# Patient Record
Sex: Female | Born: 1990 | Race: White | Hispanic: No | Marital: Single
Health system: Southern US, Community
[De-identification: ages and names within clinical notes are randomized; demographics above are authoritative.]

---
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
## 2019-07-02 DIAGNOSIS — Z419 Encounter for procedure for purposes other than remedying health state, unspecified: Secondary | ICD-10-CM | POA: Diagnosis not present

## 2019-08-02 DIAGNOSIS — Z419 Encounter for procedure for purposes other than remedying health state, unspecified: Secondary | ICD-10-CM | POA: Diagnosis not present

## 2019-08-15 DIAGNOSIS — Z20822 Contact with and (suspected) exposure to covid-19: Secondary | ICD-10-CM | POA: Diagnosis not present

## 2019-09-02 DIAGNOSIS — Z419 Encounter for procedure for purposes other than remedying health state, unspecified: Secondary | ICD-10-CM | POA: Diagnosis not present

## 2019-10-02 DIAGNOSIS — Z419 Encounter for procedure for purposes other than remedying health state, unspecified: Secondary | ICD-10-CM | POA: Diagnosis not present

## 2019-11-02 DIAGNOSIS — Z419 Encounter for procedure for purposes other than remedying health state, unspecified: Secondary | ICD-10-CM | POA: Diagnosis not present

## 2019-12-02 DIAGNOSIS — Z419 Encounter for procedure for purposes other than remedying health state, unspecified: Secondary | ICD-10-CM | POA: Diagnosis not present

## 2020-01-02 DIAGNOSIS — Z419 Encounter for procedure for purposes other than remedying health state, unspecified: Secondary | ICD-10-CM | POA: Diagnosis not present

## 2020-02-02 DIAGNOSIS — Z419 Encounter for procedure for purposes other than remedying health state, unspecified: Secondary | ICD-10-CM | POA: Diagnosis not present

## 2020-03-01 DIAGNOSIS — Z419 Encounter for procedure for purposes other than remedying health state, unspecified: Secondary | ICD-10-CM | POA: Diagnosis not present

## 2020-04-01 DIAGNOSIS — Z419 Encounter for procedure for purposes other than remedying health state, unspecified: Secondary | ICD-10-CM | POA: Diagnosis not present

## 2020-05-01 DIAGNOSIS — Z419 Encounter for procedure for purposes other than remedying health state, unspecified: Secondary | ICD-10-CM | POA: Diagnosis not present

## 2020-05-06 DIAGNOSIS — H938X2 Other specified disorders of left ear: Secondary | ICD-10-CM | POA: Diagnosis not present

## 2020-05-06 DIAGNOSIS — Z021 Encounter for pre-employment examination: Secondary | ICD-10-CM | POA: Diagnosis not present

## 2020-05-06 DIAGNOSIS — Z6837 Body mass index (BMI) 37.0-37.9, adult: Secondary | ICD-10-CM | POA: Diagnosis not present

## 2020-06-01 DIAGNOSIS — Z419 Encounter for procedure for purposes other than remedying health state, unspecified: Secondary | ICD-10-CM | POA: Diagnosis not present

## 2020-07-01 DIAGNOSIS — Z419 Encounter for procedure for purposes other than remedying health state, unspecified: Secondary | ICD-10-CM | POA: Diagnosis not present

## 2020-08-01 DIAGNOSIS — Z419 Encounter for procedure for purposes other than remedying health state, unspecified: Secondary | ICD-10-CM | POA: Diagnosis not present

## 2020-08-19 DIAGNOSIS — H5213 Myopia, bilateral: Secondary | ICD-10-CM | POA: Diagnosis not present

## 2020-09-01 DIAGNOSIS — Z419 Encounter for procedure for purposes other than remedying health state, unspecified: Secondary | ICD-10-CM | POA: Diagnosis not present

## 2020-10-01 DIAGNOSIS — Z419 Encounter for procedure for purposes other than remedying health state, unspecified: Secondary | ICD-10-CM | POA: Diagnosis not present

## 2020-11-01 DIAGNOSIS — Z419 Encounter for procedure for purposes other than remedying health state, unspecified: Secondary | ICD-10-CM | POA: Diagnosis not present

## 2020-12-01 DIAGNOSIS — Z419 Encounter for procedure for purposes other than remedying health state, unspecified: Secondary | ICD-10-CM | POA: Diagnosis not present

## 2021-01-01 DIAGNOSIS — Z419 Encounter for procedure for purposes other than remedying health state, unspecified: Secondary | ICD-10-CM | POA: Diagnosis not present

## 2021-02-01 DIAGNOSIS — Z419 Encounter for procedure for purposes other than remedying health state, unspecified: Secondary | ICD-10-CM | POA: Diagnosis not present

## 2021-03-01 DIAGNOSIS — Z419 Encounter for procedure for purposes other than remedying health state, unspecified: Secondary | ICD-10-CM | POA: Diagnosis not present

## 2021-04-01 DIAGNOSIS — Z419 Encounter for procedure for purposes other than remedying health state, unspecified: Secondary | ICD-10-CM | POA: Diagnosis not present

## 2021-05-01 DIAGNOSIS — Z419 Encounter for procedure for purposes other than remedying health state, unspecified: Secondary | ICD-10-CM | POA: Diagnosis not present

## 2021-06-01 DIAGNOSIS — Z419 Encounter for procedure for purposes other than remedying health state, unspecified: Secondary | ICD-10-CM | POA: Diagnosis not present

## 2021-07-01 DIAGNOSIS — Z419 Encounter for procedure for purposes other than remedying health state, unspecified: Secondary | ICD-10-CM | POA: Diagnosis not present

## 2021-08-01 DIAGNOSIS — Z419 Encounter for procedure for purposes other than remedying health state, unspecified: Secondary | ICD-10-CM | POA: Diagnosis not present

## 2021-09-01 DIAGNOSIS — Z419 Encounter for procedure for purposes other than remedying health state, unspecified: Secondary | ICD-10-CM | POA: Diagnosis not present

## 2021-10-01 DIAGNOSIS — Z419 Encounter for procedure for purposes other than remedying health state, unspecified: Secondary | ICD-10-CM | POA: Diagnosis not present

## 2021-11-01 DIAGNOSIS — Z419 Encounter for procedure for purposes other than remedying health state, unspecified: Secondary | ICD-10-CM | POA: Diagnosis not present

## 2021-12-01 DIAGNOSIS — Z419 Encounter for procedure for purposes other than remedying health state, unspecified: Secondary | ICD-10-CM | POA: Diagnosis not present

## 2022-01-01 DIAGNOSIS — Z419 Encounter for procedure for purposes other than remedying health state, unspecified: Secondary | ICD-10-CM | POA: Diagnosis not present

## 2022-02-01 DIAGNOSIS — Z419 Encounter for procedure for purposes other than remedying health state, unspecified: Secondary | ICD-10-CM | POA: Diagnosis not present

## 2022-03-02 DIAGNOSIS — Z419 Encounter for procedure for purposes other than remedying health state, unspecified: Secondary | ICD-10-CM | POA: Diagnosis not present

## 2022-04-02 DIAGNOSIS — Z419 Encounter for procedure for purposes other than remedying health state, unspecified: Secondary | ICD-10-CM | POA: Diagnosis not present

## 2022-05-02 DIAGNOSIS — Z419 Encounter for procedure for purposes other than remedying health state, unspecified: Secondary | ICD-10-CM | POA: Diagnosis not present

## 2022-06-02 DIAGNOSIS — Z419 Encounter for procedure for purposes other than remedying health state, unspecified: Secondary | ICD-10-CM | POA: Diagnosis not present

## 2022-07-02 DIAGNOSIS — Z419 Encounter for procedure for purposes other than remedying health state, unspecified: Secondary | ICD-10-CM | POA: Diagnosis not present

## 2022-07-12 ENCOUNTER — Telehealth: Payer: Self-pay

## 2022-07-12 NOTE — Telephone Encounter (Signed)
Chart review completed for patient. Patient is due for cervical cancer screen. Attempted to reach patient, but no answer.  Elijio Miles, Population Health Specialist.

## 2022-09-24 ENCOUNTER — Ambulatory Visit: Payer: 59

## 2022-09-24 ENCOUNTER — Encounter: Payer: Self-pay | Admitting: Podiatry

## 2022-09-24 ENCOUNTER — Ambulatory Visit (INDEPENDENT_AMBULATORY_CARE_PROVIDER_SITE_OTHER): Payer: 59 | Admitting: Podiatry

## 2022-09-24 DIAGNOSIS — M778 Other enthesopathies, not elsewhere classified: Secondary | ICD-10-CM

## 2022-09-24 DIAGNOSIS — M67471 Ganglion, right ankle and foot: Secondary | ICD-10-CM | POA: Diagnosis not present

## 2022-09-24 MED ORDER — TRIAMCINOLONE ACETONIDE 40 MG/ML IJ SUSP
20.0000 mg | Freq: Once | INTRAMUSCULAR | Status: AC
  Administered 2022-09-24: 20 mg

## 2022-09-24 NOTE — Progress Notes (Signed)
Subjective:  Patient ID: Kathy Gomez, female    DOB: [birth year],  MRN: [id number] HPI Chief Complaint  Patient presents with   Foot Pain    Dorsal/lateral foot right - small, palpable knot x few months, has went away and came back, this time its tender   New Patient (Initial Visit)    32 y.o. female presents with the above complaint.   ROS: Denies fever chills nausea vomit muscle aches pains calf pain back pain chest pain shortness of breath.  No past medical history on file.  No current outpatient medications on file.  No Known Allergies Review of Systems Objective:  There were no vitals filed for this visit.  General: Well developed, nourished, in no acute distress, alert and oriented x3   Dermatological: Skin is warm, dry and supple bilateral. Nails x 10 are well maintained; remaining integument appears unremarkable at this time. There are no open sores, no preulcerative lesions, no rash or signs of infection present.  Small nonpalpable mass proximal interspace for tender on manipulation but readily movable.  Appears to be a ganglion cyst.  Vascular: Dorsalis Pedis artery and Posterior Tibial artery pedal pulses are 2/4 bilateral with immedate capillary fill time. Pedal hair growth present. No varicosities and no lower extremity edema present bilateral.   Neruologic: Grossly intact via light touch bilateral. Vibratory intact via tuning fork bilateral. Protective threshold with Semmes Wienstein monofilament intact to all pedal sites bilateral. Patellar and Achilles deep tendon reflexes 2+ bilateral. No Babinski or clonus noted bilateral.   Musculoskeletal: No gross boney pedal deformities bilateral. No pain, crepitus, or limitation noted with foot and ankle range of motion bilateral. Muscular strength 5/5 in all groups tested bilateral.  Gait: Unassisted, Nonantalgic.    Radiographs:  Patient did not want [place] to take any.  Assessment & Plan:   Assessment: Ganglion cyst  most likely arising from the proximal joints of the fourth intermetatarsal space  Plan: I injected the area today with Kenalog and local anesthetic directly into the cyst.  I also discussed possible need for surgical excision.  I will follow-up with her in a few weeks if necessary.     Kathy Gomez T. [place], [place]

## 2022-11-05 ENCOUNTER — Ambulatory Visit: Payer: 59 | Admitting: Podiatry
